# Patient Record
Sex: Male | Born: 2000 | Race: Black or African American | Hispanic: No | Marital: Single | Smoking: Never smoker
Health system: Southern US, Community
[De-identification: ages and names within clinical notes are randomized; demographics above are authoritative.]

## PROBLEM LIST (undated history)

## (undated) DIAGNOSIS — F329 Major depressive disorder, single episode, unspecified: Secondary | ICD-10-CM

## (undated) DIAGNOSIS — Z62820 Parent-biological child conflict: Secondary | ICD-10-CM

---
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
## 2021-04-24 ENCOUNTER — Encounter (HOSPITAL_COMMUNITY): Payer: Self-pay | Admitting: Emergency Medicine

## 2021-04-24 ENCOUNTER — Emergency Department (HOSPITAL_COMMUNITY)
Admission: EM | Admit: 2021-04-24 | Discharge: 2021-04-24 | Disposition: A | Discharge status: Home / Self Care | Payer: Medicaid Other | Attending: Emergency Medicine | Admitting: Emergency Medicine

## 2021-04-24 ENCOUNTER — Emergency Department (HOSPITAL_COMMUNITY): Payer: Medicaid Other

## 2021-04-24 ENCOUNTER — Other Ambulatory Visit: Payer: Self-pay

## 2021-04-24 DIAGNOSIS — G8929 Other chronic pain: Secondary | ICD-10-CM | POA: Diagnosis not present

## 2021-04-24 DIAGNOSIS — M25561 Pain in right knee: Secondary | ICD-10-CM | POA: Diagnosis not present

## 2021-04-24 MED ORDER — IBUPROFEN 600 MG PO TABS: 600.0000 mg | ORAL_TABLET | Freq: Four times a day (QID) | ORAL | 0 refills | Status: AC | PRN

## 2021-04-24 NOTE — ED Provider Notes (Signed)
[facility]-EMERGENCY DEPT Provider Note   CSN: [id number] Arrival date & time: 04/24/21  2046     History Chief Complaint  Patient presents with   Leg Pain    Donald Mccoy is a 20 y.o. male presented emergency department with pain in his right knee.  He says been ongoing for a month.  He denies any falls or trauma.  He said is worse with ambulating and bearing weight.  He denies fevers or chills.  He denies any prior history of acute injury to the knee.  He has not taken any medication for this.  HPI     Past Medical History:  Diagnosis Date   Depressive disorder 11/27/2015   Parent-child conflict 11/27/2015    Patient Active Problem List   Diagnosis Date Noted   Depressive disorder 11/27/2015   Parent-child conflict 11/27/2015   ODD (oppositional defiant disorder) 11/26/2015    History reviewed. No pertinent surgical history.     No family history on file.  Social History   Tobacco Use   Smoking status: Never   Smokeless tobacco: Never  Substance Use Topics   Alcohol use: Yes    Comment: rare   Drug use: Yes    Types: Marijuana    Home Medications Prior to Admission medications   Medication Sig Start Date End Date Taking? Authorizing Provider  ibuprofen (ADVIL) 600 MG tablet Take 1 tablet (600 mg total) by mouth every 6 (six) hours as needed for up to 30 doses for mild pain or moderate pain. 04/24/21  Yes Skyylar Kopf, Kermit Balo, MD  pseudoephedrine-guaifenesin [facility] D) 60-600 MG 12 hr tablet Take 1 tablet by mouth every 12 (twelve) hours. X 3-5 days 10/25/16   Lowanda Foster, NP    Allergies    Patient has no known allergies.  Review of Systems   Review of Systems  Constitutional:  Negative for chills and fever.  Musculoskeletal:  Positive for arthralgias and myalgias.  Skin:  Negative for rash and wound.  Neurological:  Negative for numbness.   Physical Exam Updated Vital Signs BP 123/60 (BP Location: Left Arm)   Pulse 70   Temp  98.6 F (37 C) (Oral)   Resp 18   SpO2 92%   Physical Exam Constitutional:      General: He is not in acute distress. HENT:     Head: Normocephalic and atraumatic.  Eyes:     Conjunctiva/sclera: Conjunctivae normal.     Pupils: Pupils are equal, round, and reactive to light.  Cardiovascular:     Rate and Rhythm: Normal rate and regular rhythm.  Musculoskeletal:     Comments: No isolated tenderness of the patella No isolated tenderness of the fibular head Patient able to flex knee to 90 degrees Patient able to bear weight immediately after incident and here in the ED. No evidence of posterior knee dislocation. Distal extremity is neurovascularly intact. No knee effusion Full active and passive ROM at the knee  Skin:    General: Skin is warm and dry.  Neurological:     General: No focal deficit present.     Mental Status: He is alert and oriented to person, place, and time. Mental status is at baseline.    ED Results / Procedures / Treatments   Labs (all labs ordered are listed, but only abnormal results are displayed) Labs Reviewed - No data to display  EKG None  Radiology DG Knee Complete 4 Views Right  Result Date: 04/24/2021 CLINICAL DATA:  Right leg pain for 1 month EXAM: RIGHT KNEE - COMPLETE 4+ VIEW COMPARISON:  None. FINDINGS: Frontal, bilateral oblique, lateral views of the right knee are obtained. No fracture, subluxation, or dislocation. Mild medial compartmental joint space narrowing consistent with osteoarthritis. No joint effusion. The soft tissues are unremarkable. IMPRESSION: 1. Mild medial compartmental osteoarthritis. Electronically Signed   By: Sharlet Salina M.D.   On: 04/24/2021 21:56    Procedures Procedures   Medications Ordered in ED Medications - No data to display  ED Course  I have reviewed the triage vital signs and the nursing notes.  Pertinent labs & imaging results that were available during my care of the patient were reviewed by me  and considered in my medical decision making (see chart for details).  Patient is here with right knee pain, x-ray showed no acute fracture, questionable osteoarthritis type changes (?).   I do not see evidence for septic joint or joint effusion.  I see no evidence of dislocation.  I see no joint instability suggest an ACL or PCL tear.  There is a mild amount of clicking on the lateral knee which suggest perhaps some meniscal injury, although he really has minimal tenderness.  I advised that he buy an elastic knee brace, that we can do ibuprofen and rest as needed for the next few days.  If he has persistent symptoms he can follow-up in the sports medicine clinic.     Final Clinical Impression(s) / ED Diagnoses Final diagnoses:  Chronic pain of right knee    Rx / DC Orders ED Discharge Orders          Ordered    ibuprofen (ADVIL) 600 MG tablet  Every 6 hours PRN        04/24/21 2252             Terald Sleeper, MD 04/24/21 2316

## 2021-04-24 NOTE — ED Triage Notes (Signed)
Patient presents complaining of an issue with his leg, states it doesn't feel rate. Patient states sometimes his shin throbs and he feels his knee might be dislocated. Patient is well ambulatory with a steady gait, no injury.

## 2021-04-24 NOTE — ED Notes (Signed)
Patient ambulated to x-ray.

## 2021-08-22 ENCOUNTER — Emergency Department (HOSPITAL_COMMUNITY)
Admission: EM | Admit: 2021-08-22 | Discharge: 2021-08-23 | Disposition: A | Discharge status: Home / Self Care | Payer: Medicaid Other | Attending: Physician Assistant | Admitting: Physician Assistant

## 2021-08-22 ENCOUNTER — Other Ambulatory Visit: Payer: Self-pay

## 2021-08-22 ENCOUNTER — Emergency Department (HOSPITAL_COMMUNITY): Payer: Medicaid Other

## 2021-08-22 DIAGNOSIS — R0789 Other chest pain: Secondary | ICD-10-CM | POA: Diagnosis not present

## 2021-08-22 DIAGNOSIS — R0602 Shortness of breath: Secondary | ICD-10-CM | POA: Diagnosis not present

## 2021-08-22 DIAGNOSIS — Z5321 Procedure and treatment not carried out due to patient leaving prior to being seen by health care provider: Secondary | ICD-10-CM | POA: Insufficient documentation

## 2021-08-22 NOTE — ED Provider Notes (Signed)
Emergency Medicine Provider Triage Evaluation Note  Donald Mccoy , a 20 y.o. male  was evaluated in triage.  Pt complains of a feeling of fluid in his leg lung. Reports he feels a pressure in this area.  Review of Systems  Positive: Fluid in left lung, mild sob Negative: fever  Physical Exam  BP 138/85 (BP Location: Left Arm)    Pulse 69    Temp 98.3 F (36.8 C) (Oral)    Resp 16    Ht 5\' 9"  (1.753 m)    Wt 80.6 kg    SpO2 100%    BMI 26.26 kg/m  Gen:   Awake, no distress   Resp:  Normal effort  MSK:   Moves extremities without difficulty  Other:    Medical Decision Making  Medically screening exam initiated at 7:34 PM.  Appropriate orders placed.  [name] was informed that the remainder of the evaluation will be completed by another provider, this initial triage assessment does not replace that evaluation, and the importance of remaining in the ED until their evaluation is complete.     [name] 08/22/21 1934    08/24/21, MD 08/22/21 2226

## 2021-08-22 NOTE — ED Triage Notes (Signed)
Patient reports chest pressure in left lung x 2 months, says he believes it may be fluid and pneumonia in lung due to weather. Says pressure has gotten worse lately. Says when he eats or drinks, it goes to the left side only. Pain rated 3/10

## 2021-08-26 ENCOUNTER — Other Ambulatory Visit: Payer: Self-pay

## 2021-08-26 ENCOUNTER — Ambulatory Visit (HOSPITAL_COMMUNITY)
Admission: EM | Admit: 2021-08-26 | Discharge: 2021-08-26 | Disposition: A | Discharge status: Home / Self Care | Payer: Medicaid Other | Attending: Urgent Care | Admitting: Urgent Care

## 2021-08-26 ENCOUNTER — Encounter (HOSPITAL_COMMUNITY): Payer: Self-pay | Admitting: Emergency Medicine

## 2021-08-26 DIAGNOSIS — R12 Heartburn: Secondary | ICD-10-CM | POA: Diagnosis not present

## 2021-08-26 DIAGNOSIS — S39011A Strain of muscle, fascia and tendon of abdomen, initial encounter: Secondary | ICD-10-CM | POA: Diagnosis present

## 2021-08-26 LAB — COMPREHENSIVE METABOLIC PANEL
ALT: 16 U/L (ref 0–44)
AST: 22 U/L (ref 15–41)
Albumin: 4.2 g/dL (ref 3.5–5.0)
Alkaline Phosphatase: 50 U/L (ref 38–126)
Anion gap: 5 (ref 5–15)
BUN: 12 mg/dL (ref 6–20)
CO2: 29 mmol/L (ref 22–32)
Calcium: 9.6 mg/dL (ref 8.9–10.3)
Chloride: 103 mmol/L (ref 98–111)
Creatinine, Ser: 1.26 mg/dL — ABNORMAL HIGH (ref 0.61–1.24)
GFR, Estimated: >60 mL/min (ref >60)
Glucose, Bld: 98 mg/dL (ref 70–99)
Potassium: 4.2 mmol/L (ref 3.5–5.1)
Sodium: 137 mmol/L (ref 135–145)
Total Bilirubin: 0.7 mg/dL (ref 0.3–1.2)
Total Protein: 6.8 g/dL (ref 6.5–8.1)

## 2021-08-26 LAB — CBC WITH DIFFERENTIAL/PLATELET
Abs Immature Granulocytes: 0.01 10*3/uL (ref 0.00–0.07)
Basophils Absolute: 0 10*3/uL (ref 0.0–0.1)
Basophils Relative: 1 %
Eosinophils Absolute: 0.2 10*3/uL (ref 0.0–0.5)
Eosinophils Relative: 4 %
HCT: 42.9 % (ref 39.0–52.0)
Hemoglobin: 14.5 g/dL (ref 13.0–17.0)
Immature Granulocytes: 0 %
Lymphocytes Relative: 45 %
Lymphs Abs: 2.2 10*3/uL (ref 0.7–4.0)
MCH: 30 pg (ref 26.0–34.0)
MCHC: 33.8 g/dL (ref 30.0–36.0)
MCV: 88.8 fL (ref 80.0–100.0)
Monocytes Absolute: 0.5 10*3/uL (ref 0.1–1.0)
Monocytes Relative: 9 %
Neutro Abs: 2.1 10*3/uL (ref 1.7–7.7)
Neutrophils Relative %: 41 %
Platelets: 230 10*3/uL (ref 150–400)
RBC: 4.83 MIL/uL (ref 4.22–5.81)
RDW: 12 % (ref 11.5–15.5)
WBC: 5 10*3/uL (ref 4.0–10.5)
nRBC: 0 % (ref 0.0–0.2)

## 2021-08-26 MED ORDER — NAPROXEN 375 MG PO TABS: 375.0000 mg | ORAL_TABLET | Freq: Two times a day (BID) | ORAL | 0 refills | Status: AC

## 2021-08-26 NOTE — ED Triage Notes (Addendum)
Pt reports for about 2 months when breathing "feels heavier out of one lung than the other". Pt concerned that he has PNA and would like a chest xray. States when lays down can feel fluid moving around. Reports went to Ed on 12/17 had a xray but left before being seen by provider to get results.

## 2021-08-26 NOTE — ED Provider Notes (Signed)
[facility]    CSN: [id number] Arrival date & time: 08/26/21  1636      History   Chief Complaint Chief Complaint  Patient presents with   Shortness of Breath    HPI Donald Mccoy is a 20 y.o. male.   Pleasant 20 year old male that presents with concerns regarding a several month history of left sided pain.  He reports sometimes it feels like it is in his lungs, sometimes it feels like it is in his abdomen.  He describes the discomfort as a "tightness or pulling".  He went to the ER on 12/17 but left after having his chest x-ray performed due to the wait time.  Reviewed chest x-ray with patient today which was normal.  Patient denies any cough, sputum production, or any upper respiratory symptoms.  She denies any history of asthma and denies any wheezing.  He states that sometimes the pain gets worse positionally, sometimes it is worse with palpation, and sometimes it is worse with food.  He denies known history of heartburn.  He admits to working out, and states that sometimes it is worse with lifting.  He denies excessive EtOH intake, he does smoke.  He has not tried any over-the-counter or topical medications for his treatment.   Shortness of Breath Associated symptoms: no abdominal pain, no chest pain, no cough, no neck pain and no wheezing    Past Medical History:  Diagnosis Date   Depressive disorder 11/27/2015   Parent-child conflict 11/27/2015    Patient Active Problem List   Diagnosis Date Noted   Depressive disorder 11/27/2015   Parent-child conflict 11/27/2015   ODD (oppositional defiant disorder) 11/26/2015    History reviewed. No pertinent surgical history.     Home Medications    Prior to Admission medications   Medication Sig Start Date End Date Taking? Authorizing Provider  naproxen (NAPROSYN) 375 MG tablet Take 1 tablet (375 mg total) by mouth 2 (two) times daily with a meal for 30 doses. 08/26/21 09/10/21 Yes Parilee Hally L, PA  ibuprofen  (ADVIL) 600 MG tablet Take 1 tablet (600 mg total) by mouth every 6 (six) hours as needed for up to 30 doses for mild pain or moderate pain. 04/24/21   Terald Sleeper, MD    Family History No family history on file.  Social History Social History   Tobacco Use   Smoking status: Never   Smokeless tobacco: Never  Substance Use Topics   Alcohol use: Yes    Comment: rare   Drug use: Yes    Types: Marijuana     Allergies   Patient has no known allergies.   Review of Systems Review of Systems  Constitutional: Negative.   HENT: Negative.    Eyes: Negative.   Respiratory:  Positive for chest tightness ("pulling sensation on left lung"). Negative for apnea, cough, choking, shortness of breath, wheezing and stridor.   Cardiovascular:  Negative for chest pain, palpitations and leg swelling.  Gastrointestinal:  Negative for abdominal distention, abdominal pain, constipation, diarrhea and nausea.       LUQ "pulling"  Endocrine: Negative.   Genitourinary: Negative.   Musculoskeletal:  Positive for myalgias (L anterior torso pain intermittently - "). Negative for arthralgias, back pain, gait problem, joint swelling, neck pain and neck stiffness.  Skin: Negative.   Neurological: Negative.   Psychiatric/Behavioral: Negative.      Physical Exam Triage Vital Signs ED Triage Vitals  Enc Vitals Group     BP 08/26/21  1750 135/82     Pulse Rate 08/26/21 1750 63     Resp 08/26/21 1750 16     Temp 08/26/21 1750 99.5 F (37.5 C)     Temp Source 08/26/21 1750 Oral     SpO2 08/26/21 1750 98 %     Weight --      Height --      Head Circumference --      Peak Flow --      Pain Score 08/26/21 1749 0     Pain Loc --      Pain Edu? --      Excl. in GC? --    No data found.  Updated Vital Signs BP 135/82 (BP Location: Left Arm)    Pulse 63    Temp 99.5 F (37.5 C) (Oral)    Resp 16    SpO2 98%   Visual Acuity Right Eye Distance:   Left Eye Distance:   Bilateral Distance:     Right Eye Near:   Left Eye Near:    Bilateral Near:     Physical Exam Vitals and nursing note reviewed.  Constitutional:      General: He is not in acute distress.    Appearance: He is well-developed. He is not ill-appearing, toxic-appearing or diaphoretic.     Interventions: He is not intubated. HENT:     Head: Normocephalic and atraumatic.     Mouth/Throat:     Mouth: Mucous membranes are moist.     Pharynx: Oropharynx is clear. No pharyngeal swelling or oropharyngeal exudate.  Eyes:     Extraocular Movements: Extraocular movements intact.     Conjunctiva/sclera: Conjunctivae normal.     Pupils: Pupils are equal, round, and reactive to light.  Neck:     Thyroid: No thyromegaly.     Trachea: No tracheal deviation.  Cardiovascular:     Rate and Rhythm: Normal rate and regular rhythm. No extrasystoles are present.    Pulses: No decreased pulses.     Heart sounds: No murmur heard. Pulmonary:     Effort: Pulmonary effort is normal. No tachypnea, bradypnea, accessory muscle usage or respiratory distress. He is not intubated.     Breath sounds: Normal breath sounds. No stridor. No decreased breath sounds, wheezing, rhonchi or rales.  Chest:     Chest wall: No mass, deformity, tenderness (mild reproduction of pain to palpation), crepitus or edema. There is no dullness to percussion.  Abdominal:     General: Bowel sounds are normal.     Palpations: Abdomen is soft. There is no hepatomegaly, splenomegaly or mass.     Tenderness: There is no abdominal tenderness. There is no guarding or rebound.  Musculoskeletal:        General: No swelling. Normal range of motion.     Cervical back: Normal range of motion and neck supple.     Right lower leg: No tenderness. No edema.     Left lower leg: No tenderness. No edema.  Lymphadenopathy:     Cervical: No cervical adenopathy.  Skin:    General: Skin is warm and dry.     Capillary Refill: Capillary refill takes less than 2 seconds.      Findings: No erythema or rash.     Nails: There is no clubbing.  Neurological:     Mental Status: He is alert.  Psychiatric:        Mood and Affect: Mood normal.     UC Treatments / Results  Labs (all labs ordered are listed, but only abnormal results are displayed) Labs Reviewed  COMPREHENSIVE METABOLIC PANEL  CBC WITH DIFFERENTIAL/PLATELET    EKG   Radiology No results found.  Procedures Procedures (including critical care time)  Medications Ordered in UC Medications - No data to display  Initial Impression / Assessment and Plan / UC Course  I have reviewed the triage vital signs and the nursing notes.  Pertinent labs & imaging results that were available during my care of the patient were reviewed by me and considered in my medical decision making (see chart for details).     Left anterior abdominal strain-likely from working out/weightlifting.  Take a minimum of a 2-week break from your exercises, try naproxen as discussed, moist heat, possible topical menthol.  Labs drawn today. Heartburn-May be contributing to the postprandial discomfort.  Try over-the-counter famotidine if symptoms persist  Final Clinical Impressions(s) / UC Diagnoses   Final diagnoses:  Abdominal muscle strain, initial encounter  Heartburn     Discharge Instructions      Your symptoms are likely related to a muscle strain of the abdominal wall.  Limit weight lifting and exercising for two weeks to see if symptoms improve. Apply moist heat with a microwaveable heating bag. Take NSAID as needed for pain. Consider topical menthol (biofreeze) If NSAID ineffective, may try OTC famotidine for symptomatic relief. Labs taken today, will call if abnormal. Establish care with a PCP for follow up if symptoms persist.     ED Prescriptions     Medication Sig Dispense Auth. Provider   naproxen (NAPROSYN) 375 MG tablet Take 1 tablet (375 mg total) by mouth 2 (two) times daily with a meal for  30 doses. 30 tablet Raisa Ditto L, [place]      PDMP not reviewed this encounter.   Maretta Bees, [place] 08/26/21 [phone]

## 2021-08-26 NOTE — Discharge Instructions (Addendum)
Your symptoms are likely related to a muscle strain of the abdominal wall.  Limit weight lifting and exercising for two weeks to see if symptoms improve. Apply moist heat with a microwaveable heating bag. Take NSAID as needed for pain. Consider topical menthol (biofreeze) If NSAID ineffective, may try OTC famotidine for symptomatic relief. Labs taken today, will call if abnormal. Establish care with a PCP for follow up if symptoms persist.

## 2022-05-26 IMAGING — CR DG CHEST 1V
1 series · 1 of 1 positions shown · non-contrast
Comparison: PA Lat 10/25/2016

CLINICAL DATA: Shortness of breath and left chest pressure.

EXAM:
CHEST  1 VIEW

[w chest pa]
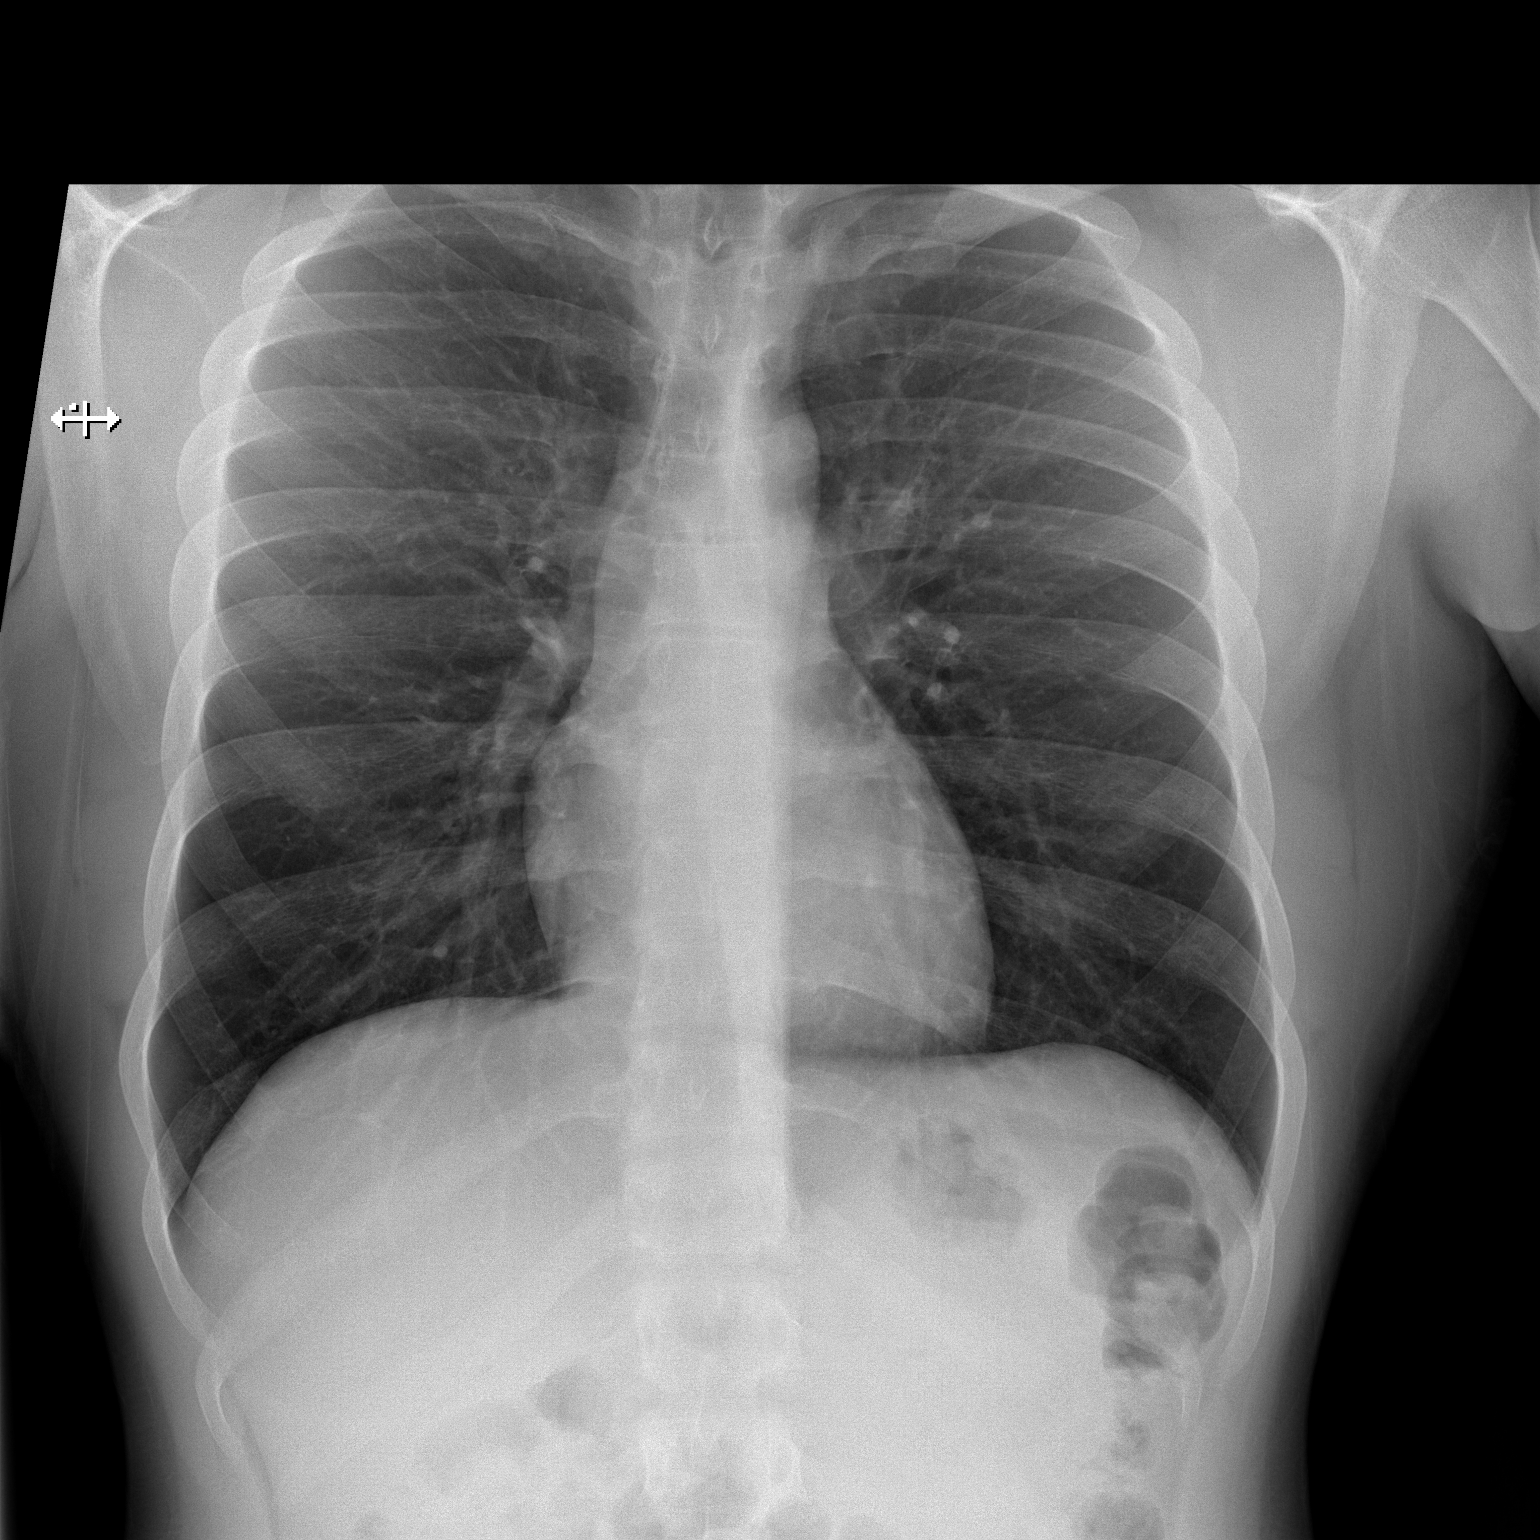

[1 of 1 positions shown; findings below may reference images not displayed]

FINDINGS: The heart size and mediastinal contours are within normal limits.
Both lungs are clear. The visualized skeletal structures are
unremarkable.
IMPRESSION: No active disease or interval changes.
